# Patient Record
Sex: Male | Born: 2000 | Race: Black or African American | Hispanic: No | Marital: Single | Smoking: Never smoker
Health system: Southern US, Community
[De-identification: ages and names within clinical notes are randomized; demographics above are authoritative.]

## PROBLEM LIST (undated history)

## (undated) DIAGNOSIS — F988 Other specified behavioral and emotional disorders with onset usually occurring in childhood and adolescence: Secondary | ICD-10-CM

## (undated) DIAGNOSIS — J45909 Unspecified asthma, uncomplicated: Secondary | ICD-10-CM

---
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
## 2021-07-06 ENCOUNTER — Ambulatory Visit (HOSPITAL_COMMUNITY)
Admission: EM | Admit: 2021-07-06 | Discharge: 2021-07-06 | Disposition: A | Discharge status: Home / Self Care | Payer: Medicaid Other

## 2021-07-06 ENCOUNTER — Encounter (HOSPITAL_COMMUNITY): Payer: Self-pay

## 2021-07-06 ENCOUNTER — Other Ambulatory Visit: Payer: Self-pay

## 2021-07-06 DIAGNOSIS — S60511A Abrasion of right hand, initial encounter: Secondary | ICD-10-CM

## 2021-07-06 NOTE — ED Triage Notes (Signed)
Pt obtained a wound a work to right hand, on July 26th. No bleeding noted.

## 2021-07-06 NOTE — ED Provider Notes (Signed)
[facility]    CSN: [id number] Arrival date & time: 07/06/21  6761      History   Chief Complaint Chief Complaint  Patient presents with   Wound Check    HPI Wayne Johnson is a 20 y.o. male.   Patient presents for assessment of wound on right hand that occurred 9 days ago after hand got caught in the belt at his job.  Has been cleansing with soap and water and keeping covered.  Denies fevers, chills, discharge, swelling, pain, numbness, tingling, decreased range of motion.  Has continued to work since injury.   Past Medical History:  Diagnosis Date   Asthma    Attention deficit disorder     Patient Active Problem List   Diagnosis Date Noted   ADHD (attention deficit hyperactivity disorder), combined type 10/22/2013   Learning disorder 10/22/2013    History reviewed. No pertinent surgical history.     Home Medications    Prior to Admission medications   Medication Sig Start Date End Date Taking? Authorizing Provider  meclizine (ANTIVERT) 25 MG tablet Take 1 tablet (25 mg total) by mouth 3 (three) times daily as needed for dizziness. 11/19/16   Deatra Canter, FNP  naproxen (NAPROSYN) 500 MG tablet Take 1 tablet (500 mg total) by mouth 2 (two) times daily. 04/06/20   Wieters, Hallie C, PA-C  OLANZapine (ZYPREXA) 5 MG tablet Take 5 mg by mouth at bedtime. 04/26/21   [provider]    Family History No family history on file.  Social History Social History   Tobacco Use   Smoking status: Never   Smokeless tobacco: Never  Vaping Use   Vaping Use: Never used  Substance Use Topics   Alcohol use: Never   Drug use: Never     Allergies   Patient has no known allergies.   Review of Systems Review of Systems  Constitutional: Negative.   HENT: Negative.    Respiratory: Negative.    Cardiovascular: Negative.   Musculoskeletal: Negative.   Skin:  Positive for wound. Negative for color change, pallor and rash.  Neurological:  Negative.     Physical Exam Triage Vital Signs ED Triage Vitals  Enc Vitals Group     BP 07/06/21 0838 137/65     Pulse Rate 07/06/21 0838 60     Resp 07/06/21 0838 18     Temp 07/06/21 0838 98.1 F (36.7 C)     Temp Source 07/06/21 0838 Oral     SpO2 07/06/21 0838 100 %     Weight --      Height --      Head Circumference --      Peak Flow --      Pain Score 07/06/21 0836 0     Pain Loc --      Pain Edu? --      Excl. in GC? --    No data found.  Updated Vital Signs BP 137/65 (BP Location: Right Arm)   Pulse 60   Temp 98.1 F (36.7 C) (Oral)   Resp 18   SpO2 100%   Visual Acuity Right Eye Distance:   Left Eye Distance:   Bilateral Distance:    Right Eye Near:   Left Eye Near:    Bilateral Near:     Physical Exam Constitutional:      Appearance: Normal appearance. He is normal weight.  HENT:     Head: Normocephalic.  Eyes:  Extraocular Movements: Extraocular movements intact.  Pulmonary:     Effort: Pulmonary effort is normal.  Skin:    Comments: Wound bed is closed, granulation tissue present over wound bed, to photo below, range of motion of hand intact, no swelling, erythema, bruising noted  Neurological:     General: No focal deficit present.     Mental Status: He is alert and oriented to person, place, and time. Mental status is at baseline.  Psychiatric:        Mood and Affect: Mood normal.        Behavior: Behavior normal.      UC Treatments / Results  Labs (all labs ordered are listed, but only abnormal results are displayed) Labs Reviewed - No data to display  EKG   Radiology No results found.  Procedures Procedures (including critical care time)  Medications Ordered in UC Medications - No data to display  Initial Impression / Assessment and Plan / UC Course  I have reviewed the triage vital signs and the nursing notes.  Pertinent labs & imaging results that were available during my care of the patient were reviewed by me  and considered in my medical decision making (see chart for details).  Abrasion of right hand, initial encounter  No signs of infection, wound is healing appropriately, discussed potential timeline for complete healing of wound with patient, given strict precautions for signs of infection and when to follow-up, verbalized understanding Final Clinical Impressions(s) / UC Diagnoses   Final diagnoses:  Abrasion of right hand, initial encounter     Discharge Instructions      Hand is healing appropriately, the top lyarer of skin is gone and it may take time for a scar to form over it  Watch for signs of swelling, pain, fever, chills, discharge from site, skin becoming hot, if these occur please follow up for evaluation    ED Prescriptions   None    PDMP not reviewed this encounter.   Valinda Hoar, [place] 07/06/21 [phone]

## 2021-07-06 NOTE — Discharge Instructions (Addendum)
Hand is healing appropriately, the top lyarer of skin is gone and it may take time for a scar to form over it  Watch for signs of swelling, pain, fever, chills, discharge from site, skin becoming hot, if these occur please follow up for evaluation

## 2021-07-07 ENCOUNTER — Encounter (HOSPITAL_COMMUNITY): Payer: Self-pay

## 2021-08-22 ENCOUNTER — Ambulatory Visit (HOSPITAL_COMMUNITY): Payer: Self-pay

## 2021-11-03 ENCOUNTER — Ambulatory Visit
Admission: RE | Admit: 2021-11-03 | Discharge: 2021-11-03 | Disposition: A | Discharge status: Home / Self Care | Payer: Medicaid Other | Source: Ambulatory Visit

## 2021-11-03 ENCOUNTER — Other Ambulatory Visit: Payer: Self-pay

## 2021-11-03 VITALS — BP 113/66 | HR 68 | Temp 98.7°F | Resp 18

## 2021-11-03 DIAGNOSIS — M62838 Other muscle spasm: Secondary | ICD-10-CM

## 2021-11-03 NOTE — Discharge Instructions (Addendum)
The swelling on your left side is a muscle that has a little bit of spasm in it.  To reduce the inflammation and swelling, please apply ice to the area for 20 minutes 4 times daily.  You can do this by putting some ice and a Ziploc bag and adding a little bit of water to it and laying it on your skin with a towel in between.

## 2021-11-03 NOTE — ED Notes (Signed)
Pt and alert & calm in room. Pt made aware provider will be in shortly.

## 2021-11-03 NOTE — ED Triage Notes (Signed)
Pt reports he has been having left lateral rib cage pain. He states the pain is like a squeezing sensation, pt denies SOB or chest pain. Pt denies falling and states "something possibly hit it at work".   Pt does not display s/s of distress.

## 2021-11-03 NOTE — ED Provider Notes (Signed)
[facility]    CSN: [id number] Arrival date & time: 11/03/21  1437    HISTORY  No chief complaint on file.  HPI Wayne Johnson. is a 20 y.o. male. Pt reports he has been having left lateral rib cage pain. He states the pain is like a squeezing sensation, pt denies SOB or chest pain. Pt denies falling and states "something possibly hit it at work".  Vital signs are stable on arrival.   Past Medical History:  Diagnosis Date   Asthma    Attention deficit disorder    Patient Active Problem List   Diagnosis Date Noted   ADHD (attention deficit hyperactivity disorder), combined type 10/22/2013   Learning disorder 10/22/2013   History reviewed. No pertinent surgical history.  Home Medications    Prior to Admission medications   Medication Sig Start Date End Date Taking? Authorizing Provider  FLUoxetine (PROZAC) 10 MG capsule Take 10 mg by mouth daily. 07/06/21   [provider]  meclizine (ANTIVERT) 25 MG tablet Take 1 tablet (25 mg total) by mouth 3 (three) times daily as needed for dizziness. 11/19/16   Deatra Canter, FNP  naproxen (NAPROSYN) 500 MG tablet Take 1 tablet (500 mg total) by mouth 2 (two) times daily. 04/06/20   Wieters, Hallie C, PA-C  OLANZapine (ZYPREXA) 5 MG tablet Take 5 mg by mouth at bedtime. 04/26/21   [provider]   Family History History reviewed. No pertinent family history. Social History Social History   Tobacco Use   Smoking status: Never   Smokeless tobacco: Never  Vaping Use   Vaping Use: Never used  Substance Use Topics   Alcohol use: Never   Drug use: Never   Allergies   Patient has no known allergies.  Review of Systems Review of Systems Pertinent findings noted in history of present illness.   Physical Exam Triage Vital Signs ED Triage Vitals  Enc Vitals Group     BP 09/29/21 0827 (!) 147/82     Pulse Rate 09/29/21 0827 72     Resp 09/29/21 0827 18     Temp 09/29/21 0827 98.3 F (36.8 C)      Temp Source 09/29/21 0827 Oral     SpO2 09/29/21 0827 98 %     Weight --      Height --      Head Circumference --      Peak Flow --      Pain Score 09/29/21 0826 5     Pain Loc --      Pain Edu? --      Excl. in GC? --   No data found.  Updated Vital Signs BP 113/66 (BP Location: Right Arm)   Pulse 68   Temp 98.7 F (37.1 C) (Oral)   Resp 18   SpO2 98%   Physical Exam Vitals and nursing note reviewed.  Constitutional:      General: He is not in acute distress.    Appearance: Normal appearance. He is not ill-appearing.  HENT:     Head: Normocephalic and atraumatic.  Eyes:     General: Lids are normal.        Right eye: No discharge.        Left eye: No discharge.     Extraocular Movements: Extraocular movements intact.     Conjunctiva/sclera: Conjunctivae normal.     Right eye: Right conjunctiva is not injected.     Left eye: Left conjunctiva is not injected.  Neck:     Trachea: Trachea and phonation normal.  Cardiovascular:     Rate and Rhythm: Normal rate and regular rhythm.     Pulses: Normal pulses.     Heart sounds: Normal heart sounds. No murmur heard.   No friction rub. No gallop.  Pulmonary:     Effort: Pulmonary effort is normal. No accessory muscle usage, prolonged expiration or respiratory distress.     Breath sounds: Normal breath sounds. No stridor, decreased air movement or transmitted upper airway sounds. No decreased breath sounds, wheezing, rhonchi or rales.  Chest:     Chest wall: No tenderness.  Musculoskeletal:        General: Normal range of motion.     Cervical back: Normal range of motion and neck supple. Normal range of motion.  Lymphadenopathy:     Cervical: No cervical adenopathy.  Skin:    General: Skin is warm and dry.     Findings: No erythema or rash.  Neurological:     General: No focal deficit present.     Mental Status: He is alert and oriented to person, place, and time.  Psychiatric:        Mood and Affect: Mood  normal.        Behavior: Behavior normal.    Visual Acuity Right Eye Distance:   Left Eye Distance:   Bilateral Distance:    Right Eye Near:   Left Eye Near:    Bilateral Near:     UC Couse / Diagnostics / Procedures:    EKG  Radiology No results found.  Procedures Procedures (including critical care time)  UC Diagnoses / Final Clinical Impressions(s)   I have reviewed the triage vital signs and the nursing notes.  Pertinent labs & imaging results that were available during my care of the patient were reviewed by me and considered in my medical decision making (see chart for details).    Final diagnoses:  Muscle spasm   Patient advised that I do not appreciate any significant swelling on his left side of his rib cage.  Patient also advised that I do feel small muscle spasm between the fifth and sixth ribs on the left axillary line.  Ice and rest recommended.  Disposition Upon Discharge:  Condition: stable for discharge home Home: take medications as prescribed; routine discharge instructions as discussed; follow up as advised.  Patient presented with an acute illness with associated systemic symptoms and significant discomfort requiring urgent management. In my opinion, this is a condition that a prudent lay person (someone who possesses an average knowledge of health and medicine) may potentially expect to result in complications if not addressed urgently such as respiratory distress, impairment of bodily function or dysfunction of bodily organs.   Routine symptom specific, illness specific and/or disease specific instructions were discussed with the patient and/or caregiver at length.   As such, the patient has been evaluated and assessed, work-up was performed and treatment was provided in alignment with urgent care protocols and evidence based medicine.  Patient/parent/caregiver has been advised that the patient may require follow up for further testing and treatment if  the symptoms continue in spite of treatment, as clinically indicated and appropriate.  If the patient was tested for COVID-19, Influenza and/or RSV, then the patient/parent/guardian was advised to isolate at home pending the results of his/her diagnostic coronavirus test and potentially longer if they're positive. I have also advised pt that if his/her COVID-19 test returns positive, it's recommended to self-isolate  for at least 10 days after symptoms first appeared AND until fever-free for 24 hours without fever reducer AND other symptoms have improved or resolved. Discussed self-isolation recommendations as well as instructions for household member/close contacts as per the [facility] and [place] DHHS, and also gave patient the COVID packet with this information.  Patient/parent/caregiver has been advised to return to the [facility] or PCP in 3-5 days if no better; to PCP or the Emergency Department if new signs and symptoms develop, or if the current signs or symptoms continue to change or worsen for further workup, evaluation and treatment as clinically indicated and appropriate  The patient will follow up with their current PCP if and as advised. If the patient does not currently have a PCP we will assist them in obtaining one.   The patient may need specialty follow up if the symptoms continue, in spite of conservative treatment and management, for further workup, evaluation, consultation and treatment as clinically indicated and appropriate.   Patient/parent/caregiver verbalized understanding and agreement of plan as discussed.  All questions were addressed during visit.  Please see discharge instructions below for further details of plan.  ED Prescriptions   None    PDMP not reviewed this encounter.  Pending results:  Labs Reviewed - No data to display  Medications Ordered in UC: Medications - No data to display  Discharge Instructions:   Discharge Instructions      The swelling on your left side  is a muscle that has a little bit of spasm in it.  To reduce the inflammation and swelling, please apply ice to the area for 20 minutes 4 times daily.  You can do this by putting some ice and a Ziploc bag and adding a little bit of water to it and laying it on your skin with a towel in between.         Theadora Rama Scales, PA-C 11/05/21 [phone]

## 2021-11-04 ENCOUNTER — Ambulatory Visit (HOSPITAL_COMMUNITY): Payer: Self-pay

## 2022-03-15 ENCOUNTER — Ambulatory Visit (HOSPITAL_COMMUNITY): Payer: Self-pay

## 2022-03-16 ENCOUNTER — Ambulatory Visit (HOSPITAL_COMMUNITY): Payer: Self-pay

## 2022-03-19 ENCOUNTER — Encounter (HOSPITAL_COMMUNITY): Payer: Self-pay | Admitting: Emergency Medicine

## 2022-03-19 ENCOUNTER — Ambulatory Visit (HOSPITAL_COMMUNITY)
Admission: EM | Admit: 2022-03-19 | Discharge: 2022-03-19 | Disposition: A | Discharge status: Home / Self Care | Payer: Medicaid Other | Attending: Family Medicine | Admitting: Family Medicine

## 2022-03-19 ENCOUNTER — Other Ambulatory Visit: Payer: Self-pay

## 2022-03-19 DIAGNOSIS — M546 Pain in thoracic spine: Secondary | ICD-10-CM

## 2022-03-19 DIAGNOSIS — M25512 Pain in left shoulder: Secondary | ICD-10-CM

## 2022-03-19 MED ORDER — TIZANIDINE HCL 4 MG PO TABS: 4.0000 mg | ORAL_TABLET | Freq: Four times a day (QID) | ORAL | 0 refills | Status: DC | PRN
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
## 2022-03-28 ENCOUNTER — Ambulatory Visit: Payer: Medicaid Other | Admitting: Family Medicine

## 2022-04-05 ENCOUNTER — Ambulatory Visit (INDEPENDENT_AMBULATORY_CARE_PROVIDER_SITE_OTHER): Payer: Medicaid Other | Admitting: Sports Medicine

## 2022-04-05 VITALS — BP 112/80 | Ht 69.0 in | Wt 158.0 lb

## 2022-04-05 DIAGNOSIS — M25572 Pain in left ankle and joints of left foot: Secondary | ICD-10-CM

## 2022-04-05 DIAGNOSIS — T148XXA Other injury of unspecified body region, initial encounter: Secondary | ICD-10-CM | POA: Diagnosis not present
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
## 2022-04-05 NOTE — Assessment & Plan Note (Signed)
Ankle pain is most consistent with a sprain.  Continue with activity and ibuprofen as needed.  Follow-up if not improving over the next month. ?

## 2022-08-17 ENCOUNTER — Ambulatory Visit (HOSPITAL_COMMUNITY)
Admission: EM | Admit: 2022-08-17 | Discharge: 2022-08-17 | Disposition: A | Discharge status: Home / Self Care | Payer: Medicaid Other | Attending: Family Medicine | Admitting: Family Medicine

## 2022-08-17 ENCOUNTER — Encounter (HOSPITAL_COMMUNITY): Payer: Self-pay | Admitting: *Deleted

## 2022-08-17 DIAGNOSIS — Z202 Contact with and (suspected) exposure to infections with a predominantly sexual mode of transmission: Secondary | ICD-10-CM

## 2022-08-17 DIAGNOSIS — Z113 Encounter for screening for infections with a predominantly sexual mode of transmission: Secondary | ICD-10-CM | POA: Diagnosis present

## 2022-08-17 LAB — HIV ANTIBODY (ROUTINE TESTING W REFLEX): HIV Screen 4th Generation wRfx: NONREACTIVE
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
## 2022-08-17 NOTE — ED Triage Notes (Signed)
Pt states that he is here for STI testing no known exposures and no sx currently.

## 2022-08-18 LAB — RPR: RPR Ser Ql: NONREACTIVE

## 2022-08-20 LAB — CYTOLOGY, (ORAL, ANAL, URETHRAL) ANCILLARY ONLY
Chlamydia: NEGATIVE
Comment: NEGATIVE
Comment: NEGATIVE
Comment: NORMAL
Neisseria Gonorrhea: NEGATIVE
Trichomonas: NEGATIVE

## 2023-04-10 ENCOUNTER — Ambulatory Visit (HOSPITAL_COMMUNITY): Payer: Medicaid Other

## 2023-10-06 ENCOUNTER — Ambulatory Visit (HOSPITAL_COMMUNITY): Payer: Medicaid Other

## 2024-01-19 ENCOUNTER — Ambulatory Visit (HOSPITAL_COMMUNITY)
Admission: RE | Admit: 2024-01-19 | Discharge: 2024-01-19 | Disposition: A | Discharge status: Home / Self Care | Payer: No Typology Code available for payment source | Source: Ambulatory Visit | Attending: Emergency Medicine | Admitting: Emergency Medicine

## 2024-01-19 ENCOUNTER — Encounter (HOSPITAL_COMMUNITY): Payer: Self-pay

## 2024-01-19 ENCOUNTER — Ambulatory Visit (INDEPENDENT_AMBULATORY_CARE_PROVIDER_SITE_OTHER): Payer: No Typology Code available for payment source

## 2024-01-19 ENCOUNTER — Other Ambulatory Visit: Payer: Self-pay

## 2024-01-19 DIAGNOSIS — Z113 Encounter for screening for infections with a predominantly sexual mode of transmission: Secondary | ICD-10-CM | POA: Diagnosis not present

## 2024-01-19 DIAGNOSIS — M545 Low back pain, unspecified: Secondary | ICD-10-CM

## 2024-01-19 DIAGNOSIS — R0781 Pleurodynia: Secondary | ICD-10-CM

## 2024-01-19 LAB — HIV ANTIBODY (ROUTINE TESTING W REFLEX): HIV Screen 4th Generation wRfx: NONREACTIVE

## 2024-01-19 MED ORDER — NAPROXEN 500 MG PO TABS: 500.0000 mg | ORAL_TABLET | Freq: Two times a day (BID) | ORAL | 0 refills | Status: DC

## 2024-01-19 MED ORDER — LIDOCAINE 5 % EX PTCH: 1.0000 | MEDICATED_PATCH | CUTANEOUS | 0 refills | Status: DC
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
## 2024-01-19 NOTE — Discharge Instructions (Signed)
Your x-rays did not reveal any acute injuries.  I have prescribed naproxen that you can take twice daily as needed for pain.  Do not take this with other NSAIDs including ibuprofen, Advil, Motrin, Aleve.  You can alternate this with Tylenol.  Otherwise alternate between heat and ice as needed for pain.  I have attached [facility] to your paperwork that you can follow-up with if pain persist.  Your other test results will come back over the next few days and someone will call if results are positive and require treatment.  Return here as needed.

## 2024-01-20 LAB — RPR: RPR Ser Ql: NONREACTIVE

## 2024-01-20 LAB — CYTOLOGY, (ORAL, ANAL, URETHRAL) ANCILLARY ONLY
Chlamydia: NEGATIVE
Comment: NEGATIVE
Comment: NEGATIVE
Comment: NORMAL
Neisseria Gonorrhea: NEGATIVE
Trichomonas: NEGATIVE

## 2024-10-08 ENCOUNTER — Ambulatory Visit (HOSPITAL_COMMUNITY)
Admission: RE | Admit: 2024-10-08 | Discharge: 2024-10-08 | Disposition: A | Discharge status: Home / Self Care | Payer: Self-pay | Source: Ambulatory Visit | Attending: Family Medicine | Admitting: Family Medicine

## 2024-10-08 ENCOUNTER — Encounter (HOSPITAL_COMMUNITY): Payer: Self-pay

## 2024-10-08 VITALS — BP 113/67 | HR 71 | Temp 97.7°F | Resp 16

## 2024-10-08 DIAGNOSIS — Z113 Encounter for screening for infections with a predominantly sexual mode of transmission: Secondary | ICD-10-CM | POA: Insufficient documentation

## 2024-10-08 DIAGNOSIS — N489 Disorder of penis, unspecified: Secondary | ICD-10-CM | POA: Insufficient documentation

## 2024-10-08 LAB — HIV ANTIBODY (ROUTINE TESTING W REFLEX): HIV Screen 4th Generation wRfx: NONREACTIVE

## 2024-10-08 NOTE — ED Triage Notes (Signed)
 Pt requesting STD testing with blood work. Denies sx's. States has a bump on penis he wants checked.

## 2024-10-08 NOTE — Discharge Instructions (Signed)
 We have sent testing for sexually transmitted infections. We will notify you of any positive results once they are received. If required, we will prescribe any medications you might need.  Please refrain from all sexual activity for at least the next seven days.

## 2024-10-08 NOTE — ED Provider Notes (Signed)
  [facility]   [id number] 10/08/24 Arrival Time: 1104  ASSESSMENT & PLAN:  1. Screening for STDs (sexually transmitted diseases)   2. Penile lesion       Discharge Instructions      We have sent testing for sexually transmitted infections. We will notify you of any positive results once they are received. If required, we will prescribe any medications you might need.  Please refrain from all sexual activity for at least the next seven days.     Pending: Labs Reviewed  HIV ANTIBODY (ROUTINE TESTING W REFLEX)  RPR  HSV 1/2 PCR (SURFACE)  CYTOLOGY, (ORAL, ANAL, URETHRAL) ANCILLARY ONLY    Will notify of any positive results. Instructed to refrain from sexual activity for at least seven days.  Reviewed expectations re: course of current medical issues. Questions answered. Outlined signs and symptoms indicating need for more acute intervention. Patient verbalized understanding. After Visit Summary given.   SUBJECTIVE:  Wayne Johnson. is a 23 y.o. male who requests STD screening. One male sexual partner. Reports he has noticed a penile lesion x 2-3 days; no h/o similar; non-painful.   OBJECTIVE:  Vitals:   10/08/24 1206  BP: 113/67  Pulse: 71  Resp: 16  Temp: 97.7 F (36.5 C)  TempSrc: Oral  SpO2: 98%     General appearance: alert, cooperative, appears stated age and no distress GU: normal appearing genitalia; few mm shallow ulceration at dorsal glans; without bleeding; non-tender to palpation Skin: warm and dry Psychological: alert and cooperative; normal mood and affect.    Labs Reviewed  HIV ANTIBODY (ROUTINE TESTING W REFLEX)  RPR  HSV 1/2 PCR (SURFACE)  CYTOLOGY, (ORAL, ANAL, URETHRAL) ANCILLARY ONLY    No Known Allergies  Past Medical History:  Diagnosis Date   Asthma    Attention deficit disorder    History reviewed. No pertinent family history. Social History   Socioeconomic History   Marital status: Single     Spouse name: Not on file   Number of children: Not on file   Years of education: Not on file   Highest education level: Not on file  Occupational History   Not on file  Tobacco Use   Smoking status: Never   Smokeless tobacco: Never  Vaping Use   Vaping status: Some Days  Substance and Sexual Activity   Alcohol use: Never   Drug use: Yes    Types: Marijuana   Sexual activity: Yes    Birth control/protection: Condom  Other Topics Concern   Not on file  Social History Narrative   ** Merged History Encounter **       Social Drivers of Corporate Investment Banker Strain: Not on file  Food Insecurity: Not on file  Transportation Needs: Not on file  Physical Activity: Not on file  Stress: Not on file  Social Connections: Not on file  Intimate Partner Violence: Not on file           [place], MD 10/08/24 1236

## 2024-10-09 ENCOUNTER — Ambulatory Visit: Payer: Self-pay

## 2024-10-09 LAB — CYTOLOGY, (ORAL, ANAL, URETHRAL) ANCILLARY ONLY
Chlamydia: POSITIVE — AB
Comment: NEGATIVE
Comment: NEGATIVE
Comment: NORMAL
Neisseria Gonorrhea: NEGATIVE
Trichomonas: NEGATIVE

## 2024-10-09 LAB — RPR: RPR Ser Ql: NONREACTIVE

## 2024-10-09 LAB — HSV 1/2 PCR (SURFACE)
HSV-1 DNA: DETECTED — AB
HSV-2 DNA: NOT DETECTED

## 2024-10-09 MED ORDER — VALACYCLOVIR HCL 1 G PO TABS: 1000.0000 mg | ORAL_TABLET | Freq: Two times a day (BID) | ORAL | 0 refills | Status: AC

## 2024-10-09 MED ORDER — DOXYCYCLINE HYCLATE 100 MG PO TABS: 100.0000 mg | ORAL_TABLET | Freq: Two times a day (BID) | ORAL | 0 refills | Status: AC

## 2024-10-29 ENCOUNTER — Emergency Department (HOSPITAL_COMMUNITY)
Admission: EM | Admit: 2024-10-29 | Discharge: 2024-10-29 | Disposition: A | Discharge status: Home / Self Care | Payer: Self-pay | Attending: Emergency Medicine | Admitting: Emergency Medicine

## 2024-10-29 ENCOUNTER — Encounter (HOSPITAL_COMMUNITY): Payer: Self-pay

## 2024-10-29 ENCOUNTER — Other Ambulatory Visit: Payer: Self-pay

## 2024-10-29 DIAGNOSIS — W228XXA Striking against or struck by other objects, initial encounter: Secondary | ICD-10-CM | POA: Insufficient documentation

## 2024-10-29 DIAGNOSIS — Y9367 Activity, basketball: Secondary | ICD-10-CM | POA: Insufficient documentation

## 2024-10-29 DIAGNOSIS — S0532XA Ocular laceration without prolapse or loss of intraocular tissue, left eye, initial encounter: Secondary | ICD-10-CM | POA: Insufficient documentation

## 2024-10-29 MED ORDER — FLUORESCEIN SODIUM 1 MG OP STRP
1.0000 | ORAL_STRIP | Freq: Once | OPHTHALMIC | Status: AC
  Administered 2024-10-29: 1 via OPHTHALMIC
  Filled 2024-10-29: qty 1

## 2024-10-29 MED ORDER — ERYTHROMYCIN 5 MG/GM OP OINT
TOPICAL_OINTMENT | Freq: Three times a day (TID) | OPHTHALMIC | Status: DC
Start: 2024-10-29 — End: 2024-10-29

## 2024-10-29 MED ORDER — GATIFLOXACIN 0.5 % OP SOLN
1.0000 [drp] | Freq: Three times a day (TID) | OPHTHALMIC | Status: DC
Start: 2024-10-29 — End: 2024-10-29
  Administered 2024-10-29: 1 [drp] via OPHTHALMIC
  Filled 2024-10-29: qty 2.5

## 2024-10-29 MED ORDER — TETRACAINE HCL 0.5 % OP SOLN
2.0000 [drp] | Freq: Once | OPHTHALMIC | Status: AC
  Administered 2024-10-29: 2 [drp] via OPHTHALMIC
  Filled 2024-10-29: qty 4

## 2024-10-29 NOTE — Discharge Instructions (Signed)
 Please followup with Dr Lavonia as discussed

## 2024-10-29 NOTE — ED Triage Notes (Signed)
 Pt arrived POV c/o a left eye injury. Pt states he was playing basketball about 30 mins ago and got hit in the eye. Pt does have some bleeding, but vision is intact, no blurriness.

## 2024-10-29 NOTE — ED Notes (Signed)
 Patient reported both eyes are blurry and RN recommend patient call someone to pick him up and not drive.

## 2024-10-29 NOTE — Consult Note (Signed)
 Ophthalmology Consult  This is a 23 yo male who was scratched in the left eye while playing basketball.  Pt with conjunctival laceration and Ophthalmology was consulted to rule out scleral involvement.  Pt states has minimal pain.  Mild foreign body sensation.    On exam pt was 20/25 in both eyes.  PERRL.  Extraocular movements intact with no pain on eye movements.  Visual field full to confrontation.  IOP 19 od 18 os with tonopen.    On slitlamp bedside exam pt was found to have a superficial partial thickness scratch temporally in the left eye from limbus going horizontally toward lateral canthus approximately 3mm.  This was not full thickness.  Pt also with a 2mm full thickness conjunctival laceration midway from limbus to lateral canthus.  This involved conjunctiva but did not involve underlying tenons or sclera and wound is reapposed.  There was minimal subconjunctival hemorrhage but did not interfere with exam.  The rest of the exam was within normal limits  On dilated exam c/d was 0.4 ou and no retinal breaks or tears.  No commotio retinae seen.    A/P Conjunctival laceration left eye:  Small and no involvement of tenons or sclera and wound is reapposed and does not require sutures.  Due to being caused by a fingernail will have pt use Gatifloxacin  qid and EES ointment tid.  Follow up in my office on Monday at 2:15 to reevaluate. Sooner prn.  Thank you for allowing me to participate in the care of this patient.  Please feel free to contact me if you have any concerns.  Evalene Raw, M.D. (Cell) [phone] (Office) [phone]

## 2024-10-29 NOTE — ED Provider Notes (Signed)
  [facility] AT [facility] Provider Note   CSN: [id number] Arrival date & time: 10/29/24  9690     Patient presents with: Eye Injury   Wayne Johnson. is a 23 y.o. male.   The history is provided by the patient.  Eye Injury This is a new problem. The current episode started 1 to 2 hours ago. The problem occurs constantly. Nothing relieves the symptoms.  Patient reports he was playing basketball just prior to arrival when he got hit in the left eye.  He reports it was a finger right hand.  He reports his eyeball is bleeding.  He does not wear contact lenses.  No previous left eye surgery     Prior to Admission medications   Not on File    Allergies: Patient has no known allergies.    Review of Systems  Updated Vital Signs BP 117/67 (BP Location: Right Arm)   Pulse 81   Temp 98.3 F (36.8 C)   Resp 16   Ht 1.778 m (5' 10)   Wt 68.9 kg   SpO2 97%   BMI 21.81 kg/m   Physical Exam CONSTITUTIONAL: Well developed/well nourished HEAD: Normocephalic/atraumatic EYES: EOMI/PERRL no proptosis.  No foreign bodies. Laceration noted OS - see photo Conjunctival injection noted bilaterally.  Small amount of blood is noted OS ENMT: Mucous membranes moist, no evidence of facial traum NEURO: Pt is awake/alert/appropriate, moves all extremitiesx4.  No facial droop.        (all labs ordered are listed, but only abnormal results are displayed) Labs Reviewed - No data to display  EKG: None  Radiology: No results found.   Procedures   Medications Ordered in the ED  fluorescein  ophthalmic strip 1 strip (1 strip Both Eyes Given 10/29/24 0417)  tetracaine  (PONTOCAINE) 0.5 % ophthalmic solution 2 drop (2 drops Both Eyes Given 10/29/24 0416)    Clinical Course as of 10/29/24 0624  Thu Oct 29, 2024  0453 Concern for scleral laceration.  Discussed with Dr. Lavonia, will see patient [DW]  [phone] Pt seen by dr lavonia with ophthalmology He has fully  examined patient Laceration does not require repair Recommends erythromycin , gatifloxacin  drops and f/u early next week [DW]    Clinical Course User Index [DW] Midge Golas, MD                                 Medical Decision Making Risk Prescription drug management.        Final diagnoses:  Scleral laceration of left eye, initial encounter    ED Discharge Orders     None          Midge Golas, MD 10/29/24 [id number]
